# Patient Record
Sex: Female | Born: 1990 | Race: Black or African American | Hispanic: No | Marital: Single | Smoking: Current every day smoker
Health system: Southern US, Community
[De-identification: ages and names within clinical notes are randomized; demographics above are authoritative.]

## PROBLEM LIST (undated history)

## (undated) DIAGNOSIS — F319 Bipolar disorder, unspecified: Principal | ICD-10-CM

## (undated) HISTORY — DX: Bipolar disorder, unspecified: F31.9

---
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
## 2011-11-22 ENCOUNTER — Other Ambulatory Visit (HOSPITAL_COMMUNITY)
Admission: RE | Admit: 2011-11-22 | Discharge: 2011-11-22 | Disposition: A | Discharge status: Home / Self Care | Payer: Managed Care, Other (non HMO) | Source: Ambulatory Visit | Attending: Obstetrics and Gynecology | Admitting: Obstetrics and Gynecology

## 2011-11-22 DIAGNOSIS — Z124 Encounter for screening for malignant neoplasm of cervix: Secondary | ICD-10-CM | POA: Insufficient documentation

## 2011-11-22 DIAGNOSIS — Z113 Encounter for screening for infections with a predominantly sexual mode of transmission: Secondary | ICD-10-CM | POA: Insufficient documentation

## 2014-01-28 ENCOUNTER — Ambulatory Visit: Payer: Managed Care, Other (non HMO) | Admitting: Sports Medicine

## 2014-02-01 ENCOUNTER — Ambulatory Visit: Payer: Managed Care, Other (non HMO) | Admitting: Sports Medicine

## 2014-02-01 DIAGNOSIS — Z0289 Encounter for other administrative examinations: Secondary | ICD-10-CM

## 2014-02-19 ENCOUNTER — Ambulatory Visit (INDEPENDENT_AMBULATORY_CARE_PROVIDER_SITE_OTHER): Payer: Managed Care, Other (non HMO) | Admitting: Family Medicine

## 2014-02-19 ENCOUNTER — Encounter: Payer: Self-pay | Admitting: Family Medicine

## 2014-02-19 VITALS — BP 119/66 | HR 69 | Ht 62.0 in | Wt 183.0 lb

## 2014-02-19 DIAGNOSIS — Z131 Encounter for screening for diabetes mellitus: Secondary | ICD-10-CM

## 2014-02-19 DIAGNOSIS — F319 Bipolar disorder, unspecified: Secondary | ICD-10-CM

## 2014-02-19 DIAGNOSIS — Z13220 Encounter for screening for lipoid disorders: Secondary | ICD-10-CM

## 2014-02-19 HISTORY — DX: Bipolar disorder, unspecified: F31.9

## 2014-02-19 MED ORDER — ARIPIPRAZOLE 10 MG PO TABS: 10.0000 mg | ORAL_TABLET | Freq: Every day | ORAL | Status: DC

## 2014-02-19 MED ORDER — LAMOTRIGINE 100 MG PO TABS: 50.0000 mg | ORAL_TABLET | Freq: Every day | ORAL | Status: DC

## 2014-02-19 NOTE — Progress Notes (Signed)
CC: Kaitlyn Morrow is a 23 y.o. female is here for Establish Care   Subjective: HPI:  Very pleasant 23 year old here to establish care  Patient reports a history of bipolar disorder that has been present for the past 3 years. It reached a low point earlier in March when she was having suicidal ideation. She was admitted through [facility] and spent what she estimated to be 5 days at [facility].  On review through [facility] appears she had a normal CBC, normal urinalysis, normal drug screen, normal CMP.  She states that since the time of discharge she feels extremely much more stable has not had any depressive or manic episodes. She's getting 6 hours of sleep every night. She denies any thoughts wanting to harm herself or others, she stays with her parents and has a strong social support through them.  Her only concern is that since starting Abilify and Lamictal she's having new sensations of anxiety that has been present for the past 2 weeks occurring most days of the week nothing particularly makes it better. Seems to be worse soon after taking Lamictal and is accompanied by hours of fatigue when morning lamictal taken.  She denies paranoia, hallucinations, nor mental disturbance other than that described above.  She sees a therapist it sounds like almost weekly and has a psychiatry appointment set up for the end of April  Review of Systems - General ROS: negative for - chills, fever, night sweats, weight gain or weight loss Ophthalmic ROS: negative for - decreased vision Psychological ROS: negative for - depression ENT ROS: negative for - hearing change, nasal congestion, tinnitus or allergies Hematological and Lymphatic ROS: negative for - bleeding problems, bruising or swollen lymph nodes Breast ROS: negative Respiratory ROS: no cough, shortness of breath, or wheezing Cardiovascular ROS: no chest pain or dyspnea on exertion Gastrointestinal ROS: no abdominal pain, change in bowel habits, or  black or bloody stools Genito-Urinary ROS: negative for - genital discharge, genital ulcers, incontinence or abnormal bleeding from genitals Musculoskeletal ROS: negative for - joint pain or muscle pain Neurological ROS: negative for - headaches or memory loss Dermatological ROS: negative for lumps, mole changes, rash and skin lesion changes  Past Medical History  Diagnosis Date  . Bipolar disorder, unspecified 02/19/2014    History reviewed. No pertinent past surgical history. History reviewed. No pertinent family history.  History   Social History  . Marital Status: Single    Spouse Name: N/A    Number of Children: N/A  . Years of Education: N/A   Occupational History  . Not on file.   Social History Main Topics  . Smoking status: Current Every Day Smoker -- 3 years  . Smokeless tobacco: Not on file  . Alcohol Use: Yes  . Drug Use: No  . Sexual Activity: Yes    Partners: Male   Other Topics Concern  . Not on file   Social History Narrative  . No narrative on file     Objective: BP 119/66  Pulse 69  Ht 5\' 2"  (1.575 m)  Wt 183 lb (83.008 kg)  BMI 33.46 kg/m2  LMP 02/13/2014  General: Alert and Oriented, No Acute Distress HEENT: Pupils equal, round, reactive to light. Conjunctivae clear.  External ears unremarkable, canals clear with intact TMs with appropriate landmarks.  Middle ear appears open without effusion. Pink inferior turbinates.  Moist mucous membranes, pharynx without inflammation nor lesions.  Neck supple without palpable lymphadenopathy nor abnormal masses. Lungs: Clear to auscultation bilaterally,  no wheezing/ronchi/rales.  Comfortable work of breathing. Good air movement. Cardiac: Regular rate and rhythm. Normal S1/S2.  No murmurs, rubs, nor gallops.   Mental Status: No depression, anxiety, nor agitation. Skin: Warm and dry.  Assessment & Plan: Kaitlyn Morrow was seen today for establish care.  Diagnoses and associated orders for this visit:  Bipolar  disorder, unspecified - ARIPiprazole (ABILIFY) 10 MG tablet; Take 1 tablet (10 mg total) by mouth daily. - lamoTRIgine (LAMICTAL) 100 MG tablet; Take 0.5 tablets (50 mg total) by mouth daily.  Diabetes mellitus screening  Lipid screening - Lipid panel    Bipolar disorder: Controlled however suspect lamictal is causing side effects therefore we'll slowly taper this down every 2 weeks, I've asked her to call me in 2 weeks if she's interested in cutting down to 25 mg daily until her psychiatric appointment occurs. Continue current Abilify 10 mg daily. Diabetic screening: Nonfasting glucose was within normal limits in the emergency room no further intervention She's due for routine dyslipidemia screening  Return if symptoms worsen or fail to improve.

## 2016-09-22 ENCOUNTER — Telehealth: Payer: Self-pay

## 2016-09-22 NOTE — Telephone Encounter (Signed)
Mother of pt called asking if a referral can be placed for pt. She has a history of Bipolar disorder and stated she having significant problems coping. Will be in town and available whenever the referral is placed. Please advise.

## 2016-09-23 NOTE — Telephone Encounter (Signed)
I have not seen this patient, nor has she been here in 2 years. She should establish with one of our other providers before I am comfortable placing orders.

## 2016-09-23 NOTE — Telephone Encounter (Signed)
Mother of patient notified of information. Will reach out to pt to schedule an appointment to establish care.

## 2016-11-09 ENCOUNTER — Encounter: Payer: Self-pay | Admitting: *Deleted

## 2016-11-09 ENCOUNTER — Emergency Department (INDEPENDENT_AMBULATORY_CARE_PROVIDER_SITE_OTHER)
Admission: EM | Admit: 2016-11-09 | Discharge: 2016-11-09 | Disposition: A | Discharge status: Home / Self Care | Payer: Managed Care, Other (non HMO) | Source: Home / Self Care | Attending: Family Medicine | Admitting: Family Medicine

## 2016-11-09 DIAGNOSIS — N898 Other specified noninflammatory disorders of vagina: Secondary | ICD-10-CM

## 2016-11-09 DIAGNOSIS — Z711 Person with feared health complaint in whom no diagnosis is made: Secondary | ICD-10-CM

## 2016-11-09 LAB — HIV ANTIBODY (ROUTINE TESTING W REFLEX): HIV 1&2 Ab, 4th Generation: NONREACTIVE

## 2016-11-09 NOTE — ED Provider Notes (Signed)
CSN: [id number]     Arrival date & time 11/09/16  1148 History   First MD Initiated Contact with Patient 11/09/16 1339     Chief Complaint  Patient presents with  . Vaginal Itching  . Groin Swelling   (Consider location/radiation/quality/duration/timing/severity/associated sxs/prior Treatment) HPI  Kaitlyn LushBeverley Morrow is a 25 y.o. female presenting to UC with c/o vaginal discharge and irritation with itching and mild burning when scratching too much for about 3 days.  Hx of BV in the past, this feels similar. Pt notes she does have unprotected intercourse and is "due" for STD check. She is requesting to be tested for GC/chlamydia, syphilis and HIV.  Denies fever, chills, n/v/d. Denies abdominal pain or back pain. Denies urinary symptoms.    Past Medical History:  Diagnosis Date  . Bipolar disorder, unspecified 02/19/2014   History reviewed. No pertinent surgical history. Family History  Problem Relation Age of Onset  . Diabetes Other   . Hyperlipidemia Other    Social History  Substance Use Topics  . Smoking status: Current Every Day Smoker    Years: 3.00  . Smokeless tobacco: Never Used  . Alcohol use Yes   OB History    No data available     Review of Systems  Constitutional: Negative for chills and fever.  Gastrointestinal: Negative for abdominal pain, diarrhea, nausea and vomiting.  Genitourinary: Positive for vaginal discharge and vaginal pain (mild burning, itching). Negative for dysuria, flank pain, frequency, genital sores, hematuria, pelvic pain and urgency.  Musculoskeletal: Negative for back pain and myalgias.    Allergies  Prilosec [omeprazole]  Home Medications   Prior to Admission medications   Medication Sig Start Date End Date Taking? Authorizing Provider  Norethin Ace-Eth Estrad-FE (MIBELAS 24 FE PO) Take by mouth.   Yes Historical Provider, MD   Meds Ordered and Administered this Visit  Medications - No data to display  BP 128/75 (BP Location: Left  Arm)   Pulse 91   Temp 98 F (36.7 C) (Oral)   Resp 14   Wt 159 lb (72.1 kg)   LMP 11/03/2016   SpO2 99%   BMI 29.08 kg/m  No data found.   Physical Exam  Constitutional: She is oriented to person, place, and time. She appears well-developed and well-nourished. No distress.  HENT:  Head: Normocephalic and atraumatic.  Mouth/Throat: Oropharynx is clear and moist.  Eyes: EOM are normal.  Neck: Normal range of motion.  Cardiovascular: Normal rate and regular rhythm.   Pulmonary/Chest: Effort normal. No respiratory distress.  Abdominal: Soft. She exhibits no distension. There is no tenderness. There is no CVA tenderness.  Genitourinary:  Genitourinary Comments: Chaperoned exam: normal external exam. Moderate amount of thick white-yellow vaginal discharge. No bleeding. No CMT, adnexal tenderness or masses.  Musculoskeletal: Normal range of motion.  Neurological: She is alert and oriented to person, place, and time.  Skin: Skin is warm and dry. She is not diaphoretic.  Psychiatric: She has a normal mood and affect. Her behavior is normal.  Nursing note and vitals reviewed.   Urgent Care Course   Clinical Course     Procedures (including critical care time)  Labs Review Labs Reviewed  GC/CHLAMYDIA PROBE AMP  WET PREP BY MOLECULAR PROBE  RPR  HIV ANTIBODY (ROUTINE TESTING)    Imaging Review No results found.   MDM   1. Vaginal discharge   2. Concern about STD in female without diagnosis    Pt c/o vaginal discharge and irritation with  itching. Requested STD testing w/o known exposure.  Wet prep, GC/chlamydia tests pending.  Pt will be notified of results and if treatment is indicated. Advised to call Thursday to check on Wet prep if she does not hear back by early afternoon as [place] on [place].     Junius Finnerrin O'Malley, PA-C 11/09/16 [phone]

## 2016-11-09 NOTE — ED Triage Notes (Signed)
Patient c/o 3 days of vaginal itching, swelling and white thin discharge. She would like g/c chlamydia done.   PASSWORD FOR TESTING :Durward Mallardamille

## 2016-11-10 ENCOUNTER — Telehealth: Payer: Self-pay | Admitting: Emergency Medicine

## 2016-11-10 LAB — WET PREP BY MOLECULAR PROBE
Candida species: POSITIVE — AB
Gardnerella vaginalis: NEGATIVE
Trichomonas vaginosis: NEGATIVE

## 2016-11-10 LAB — RPR

## 2016-11-10 LAB — GC/CHLAMYDIA PROBE AMP
CT Probe RNA: NOT DETECTED
GC Probe RNA: NOT DETECTED
